# Patient Record
Sex: Male | Born: 1975 | Race: White | Hispanic: No | Marital: Single | Smoking: Never smoker
Health system: Southern US, Community
[De-identification: ages and names within clinical notes are randomized; demographics above are authoritative.]

## PROBLEM LIST (undated history)

## (undated) DIAGNOSIS — E7800 Pure hypercholesterolemia, unspecified: Secondary | ICD-10-CM

## (undated) HISTORY — PX: BACK SURGERY: SHX140

## (undated) HISTORY — PX: KNEE ARTHROSCOPY: SUR90

---

## 2016-04-13 ENCOUNTER — Encounter (HOSPITAL_COMMUNITY): Payer: Self-pay | Admitting: Emergency Medicine

## 2016-04-13 ENCOUNTER — Emergency Department (HOSPITAL_COMMUNITY): Payer: BLUE CROSS/BLUE SHIELD

## 2016-04-13 DIAGNOSIS — R0602 Shortness of breath: Secondary | ICD-10-CM | POA: Diagnosis present

## 2016-04-13 DIAGNOSIS — I10 Essential (primary) hypertension: Secondary | ICD-10-CM | POA: Insufficient documentation

## 2016-04-13 DIAGNOSIS — R079 Chest pain, unspecified: Secondary | ICD-10-CM | POA: Diagnosis not present

## 2016-04-13 LAB — I-STAT TROPONIN, ED: Troponin i, poc: 0 ng/mL (ref 0.00–0.08)

## 2016-04-13 NOTE — ED Triage Notes (Signed)
Pt here with intermittent CP ongoing for the past week, worsening today. Pt has hx of htn, high cholesterol, significant family history of heart problems.

## 2016-04-14 ENCOUNTER — Emergency Department (HOSPITAL_COMMUNITY)
Admission: EM | Admit: 2016-04-14 | Discharge: 2016-04-14 | Disposition: A | Discharge status: Home / Self Care | Payer: BLUE CROSS/BLUE SHIELD | Attending: Emergency Medicine | Admitting: Emergency Medicine

## 2016-04-14 DIAGNOSIS — R079 Chest pain, unspecified: Secondary | ICD-10-CM

## 2016-04-14 HISTORY — DX: Pure hypercholesterolemia, unspecified: E78.00

## 2016-04-14 LAB — CBC WITH DIFFERENTIAL/PLATELET
BASOS ABS: 0 10*3/uL (ref 0.0–0.1)
BASOS PCT: 0 %
EOS ABS: 0.3 10*3/uL (ref 0.0–0.7)
EOS PCT: 3 %
HCT: 39.6 % (ref 39.0–52.0)
Hemoglobin: 13.3 g/dL (ref 13.0–17.0)
Lymphocytes Relative: 44 %
Lymphs Abs: 3.9 10*3/uL (ref 0.7–4.0)
MCH: 29.4 pg (ref 26.0–34.0)
MCHC: 33.6 g/dL (ref 30.0–36.0)
MCV: 87.6 fL (ref 78.0–100.0)
MONO ABS: 0.8 10*3/uL (ref 0.1–1.0)
Monocytes Relative: 9 %
Neutro Abs: 3.9 10*3/uL (ref 1.7–7.7)
Neutrophils Relative %: 44 %
PLATELETS: 320 10*3/uL (ref 150–400)
RBC: 4.52 MIL/uL (ref 4.22–5.81)
RDW: 13 % (ref 11.5–15.5)
WBC: 8.9 10*3/uL (ref 4.0–10.5)

## 2016-04-14 LAB — BASIC METABOLIC PANEL
ANION GAP: 11 (ref 5–15)
BUN: 15 mg/dL (ref 6–20)
CALCIUM: 9.2 mg/dL (ref 8.9–10.3)
CO2: 25 mmol/L (ref 22–32)
Chloride: 101 mmol/L (ref 101–111)
Creatinine, Ser: 0.79 mg/dL (ref 0.61–1.24)
GLUCOSE: 93 mg/dL (ref 65–99)
Potassium: 3.8 mmol/L (ref 3.5–5.1)
SODIUM: 137 mmol/L (ref 135–145)

## 2016-04-14 LAB — TROPONIN I

## 2016-04-14 NOTE — ED Notes (Signed)
RN called to check on status of CBC and BMP ordered last night; lab states they "never got that blood"; pt to be stuck again for recollect

## 2016-04-14 NOTE — ED Provider Notes (Signed)
[facility]-EMERGENCY DEPT Provider Note   CSN: [id number] Arrival date & time: 04/13/16  2047     History   Chief Complaint Chief Complaint  Patient presents with  . Chest Pain  . Shortness of Breath    HPI Darryl Valdez is a 40 y.o. male.  HPI  Patient with PMH of high cholesterol and hypertension comes to the ER for evaluation of months of chest pain. His dad had 5 vessel bipass 12 years ago, his brother who is 10 years older than him had a heart attack recently. Pt is concerned that he likely has genetic heart disease as well. His pain is not exertional. It lasts anywhere from 1 minutes to 10 minutes. He broke a few right side carpal bones 1 week ago but says his pains have not changed and that this has been persisting for weeks. He came in today because his dad said that because of family history he really needed to be looked at.  Denies SOB, fevers, chills, cough, N/V/D, weakness, headache, confusion.  Past Medical History:  Diagnosis Date  . High cholesterol   . Hypertension     There are no active problems to display for this patient.   Past Surgical History:  Procedure Laterality Date  . BACK SURGERY    . KNEE ARTHROSCOPY Right        Home Medications    Prior to Admission medications   Medication Sig Start Date End Date Taking? Authorizing Provider  citalopram (CELEXA) 20 MG tablet Take 20 mg by mouth daily. 04/03/16  Yes Historical Provider, MD  ibuprofen (ADVIL,MOTRIN) 600 MG tablet Take 600 mg by mouth every 6 (six) hours as needed for mild pain.  04/03/16  Yes Historical Provider, MD  rosuvastatin (CRESTOR) 20 MG tablet Take 20 mg by mouth daily. 04/03/16  Yes Historical Provider, MD    Family History History reviewed. No pertinent family history.  Social History Social History  Substance Use Topics  . Smoking status: Never Smoker  . Smokeless tobacco: Never Used  . Alcohol use Yes     Comment: 3 times a week     Allergies   Review of patient's  allergies indicates no known allergies.   Review of Systems Review of Systems  Review of Systems All other systems negative except as documented in the HPI. All pertinent positives and negatives as reviewed in the HPI.  Physical Exam Updated Vital Signs BP 125/89   Pulse (!) 44   Temp 98 F (36.7 C)   Resp 13   Ht 6' (1.829 m)   Wt 77.1 kg   SpO2 99%   BMI 23.06 kg/m   Physical Exam  Constitutional: He is oriented to person, place, and time. He appears well-developed and well-nourished.  HENT:  Head: Normocephalic and atraumatic.  Eyes: EOM are normal. Pupils are equal, round, and reactive to light.  Neck: Normal range of motion.  Cardiovascular: Normal rate and regular rhythm.   Pulmonary/Chest: Effort normal and breath sounds normal. No tachypnea. No respiratory distress. He exhibits no tenderness and no crepitus.  Musculoskeletal: Normal range of motion.  Neurological: He is alert and oriented to person, place, and time.  Skin: Skin is warm and dry.     ED Treatments / Results  Labs (all labs ordered are listed, but only abnormal results are displayed) Labs Reviewed  TROPONIN I  CBC WITH DIFFERENTIAL/PLATELET  BASIC METABOLIC PANEL  BASIC METABOLIC PANEL  CBC  I-STAT TROPOININ, ED  EKG  EKG Interpretation None       Radiology Dg Chest 2 View  Result Date: 04/13/2016 CLINICAL DATA:  Left side chest pain, shortness of Breath EXAM: CHEST  2 VIEW COMPARISON:  None. FINDINGS: The heart size and mediastinal contours are within normal limits. Both lungs are clear. The visualized skeletal structures are unremarkable. IMPRESSION: No active cardiopulmonary disease. Electronically Signed   By: Natasha Mead M.D.   On: 04/13/2016 21:47    Procedures Procedures (including critical care time)  Medications Ordered in ED Medications - No data to display   Initial Impression / Assessment and Plan / ED Course  I have reviewed the triage vital signs and the nursing  notes.  Pertinent labs & imaging results that were available during my care of the patient were reviewed by me and considered in my medical decision making (see chart for details).  Clinical Course    Patient has had two negative Troponins and a normal EKG. He has a HEART score of 2 and his pain is very atypical. After discussing patient with Dr. Blinda Leatherwood he will require close cardiology follow-up. Pt given a cardiology referral.  Final Clinical Impressions(s) / ED Diagnoses   Final diagnoses:  Chest pain, unspecified chest pain type   Patient is to be discharged with recommendation to follow up with PCP in regards to today's hospital visit. Chest pain is not likely of cardiac or pulmonary etiology d/t presentation, perc negative, VSS, no tracheal deviation, no JVD or new murmur, RRR, breath sounds equal bilaterally, EKG without acute abnormalities, negative troponin, and negative CXR. Pt has been advised to return to the ED is CP becomes exertional, associated with diaphoresis or nausea, radiates to left jaw/arm, worsens or becomes concerning in any way. Pt appears reliable for follow up and is agreeable to discharge.   Case has been discussed with and seen by Dr. Blinda Leatherwood  who agrees with the above plan to discharge.    New Prescriptions New Prescriptions   No medications on file     Marlon Pel, PA-C 04/14/16 0515    Gilda Crease, MD 04/14/16 [phone]

## 2016-05-12 ENCOUNTER — Encounter: Payer: Self-pay | Admitting: Internal Medicine

## 2016-05-12 ENCOUNTER — Ambulatory Visit (INDEPENDENT_AMBULATORY_CARE_PROVIDER_SITE_OTHER): Payer: BLUE CROSS/BLUE SHIELD | Admitting: Internal Medicine

## 2016-05-12 VITALS — BP 131/86 | HR 62 | Ht 73.0 in | Wt 177.0 lb

## 2016-05-12 DIAGNOSIS — R079 Chest pain, unspecified: Secondary | ICD-10-CM | POA: Diagnosis not present

## 2016-05-12 DIAGNOSIS — E782 Mixed hyperlipidemia: Secondary | ICD-10-CM

## 2016-05-12 DIAGNOSIS — I10 Essential (primary) hypertension: Secondary | ICD-10-CM | POA: Diagnosis not present

## 2016-05-12 NOTE — Patient Instructions (Addendum)
Medication Instructions:  START- Aspirin 81 mg daily  Labwork: None Ordered  Testing/Procedures: Your physician has requested that you have an exercise tolerance test. For further information please visit [email or web address]. Please also follow instruction sheet, as given.  Follow-Up: Your physician recommends that you schedule a follow-up appointment in: November   Any Other Special Instructions Will Be Listed Below (If Applicable).   If you need a refill on your cardiac medications before your next appointment, please call your pharmacy.

## 2016-05-12 NOTE — Progress Notes (Signed)
OFFICE NOTE  Chief Complaint:  Chest pain  Primary Care Physician: No primary care provider on file.  HPI:  Darryl Valdez is a 40 y.o. male who presents after recent ER visit for chest pain. His past medical significant for hypertension and dyslipidemia. He is not physically active be on chasing his kids around. 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 He has a strong family history of coronary disease, and fact his brother who is age 2 had 2 coronary stents placed. His dad had four-vessel bypass at age 8. Recently he was seen in the emergency department at [facility] for chest pain. He ruled out for MI and was felt safe to discharge with cardiology follow-up. EKG performed demonstrated normal sinus rhythm with mild J-point elevation diffusely concerning for early repolarization. He shared lab work with me from his primary care provider indicating that his total cholesterol was well over 200 and he was recently started on Crestor 20 mg daily. Currently his total cholesterol is around 160 with an LDL of 78. He reports that he still has some intermittent episodes of chest pain which is described as sharp and sometimes radiates to the left arm. Not necessarily associated with exertion or relieved by rest. The episodes are not as significant as the episode that brought him to the hospital.  PMHx:  Past Medical History:  Diagnosis Date  . High cholesterol     Past Surgical History:  Procedure Laterality Date  . BACK SURGERY    . KNEE ARTHROSCOPY Right     FAMHx:  Family History  Problem Relation Age of Onset  . Hypertension Mother   . Hyperlipidemia Mother   . Hypertension Father   . Hyperlipidemia Father   . CAD Father   . CAD Brother   . Hypertension Brother   . Hyperlipidemia Brother     SOCHx:   reports that he has never smoked. He has never used smokeless tobacco. He reports that he drinks alcohol. He reports that he  does not use drugs.  ALLERGIES:  No Known Allergies  ROS: Pertinent items noted in HPI and remainder of comprehensive ROS otherwise negative.  HOME MEDS: Current Outpatient Prescriptions on File Prior to Visit  Medication Sig Dispense Refill  . citalopram (CELEXA) 20 MG tablet Take 20 mg by mouth daily.  8  . ibuprofen (ADVIL,MOTRIN) 600 MG tablet Take 600 mg by mouth every 6 (six) hours as needed for mild pain.   0  . rosuvastatin (CRESTOR) 20 MG tablet Take 20 mg by mouth daily.  2   No current facility-administered medications on file prior to visit.     LABS/IMAGING: No results found for this or any previous visit (from the past 48 hour(s)). No results found.  WEIGHTS: Wt Readings from Last 3 Encounters:  05/12/16 177 lb (80.3 kg)  04/13/16 170 lb (77.1 kg)    VITALS: BP 131/86   Pulse 62   Ht 6\' 1"  (1.854 m)   Wt 177 lb (80.3 kg)   BMI 23.35 kg/m   EXAM: General appearance: alert and no distress Neck: no carotid bruit and no JVD Lungs: clear to auscultation bilaterally Heart: regular rate and rhythm, S1, S2 normal, no murmur, click, rub or gallop Abdomen: soft, non-tender; bowel sounds normal; no masses,  no organomegaly Extremities: right wrist in a splint from an elevator accident Pulses: 2+ and symmetric Skin: Skin color, texture, turgor normal. No  rashes or lesions Neurologic: Grossly normal Psych: Pleasant  EKG: I personally reviewed his EKGs from the hospital on 04/13/2016 demonstrating normal sinus rhythm, no ischemia and early repolarization changes.  ASSESSMENT: 1. Chest pain with typical and atypical features 2. Strong family history of premature coronary artery disease 3. Dyslipidemia  PLAN: 1.   Darryl Valdez has a compelling family history of coronary disease of early onset and has been having episodes of chest pain. He has dyslipidemia but it is significantly improved on Crestor 20 mg daily. His LDL is now just above 70. Given his ongoing  symptoms like for him to undergo exercise treadmill stress testing. If this study is abnormal or equivocal, then I would recommend CT coronary angiography to further assess for obstructive coronary disease. This would give us a better idea about his overall risk and plaque burden. Ultimately, if he has continued symptoms he may need traditional coronary angiography. I've advised starting aspirin 81 mg daily. He says he will be traveling for work over the next 2 weeks and if he were to have any recurrent chest pain he's advised to go to the nearest hospital to seek treatment. Follow-up with me after he returns.  Chrystie NoseKenneth C. Hilty, MD, [facility] Attending Cardiologist [facility]  Chrystie NoseKenneth C Hilty 05/12/2016, 5:53 PM

## 2016-05-30 ENCOUNTER — Telehealth (HOSPITAL_COMMUNITY): Payer: Self-pay

## 2016-05-30 NOTE — Telephone Encounter (Signed)
Encounter complete. 

## 2016-06-01 ENCOUNTER — Ambulatory Visit (HOSPITAL_COMMUNITY)
Admission: RE | Admit: 2016-06-01 | Discharge: 2016-06-01 | Disposition: A | Discharge status: Home / Self Care | Payer: BLUE CROSS/BLUE SHIELD | Source: Ambulatory Visit | Attending: Cardiovascular Disease | Admitting: Cardiovascular Disease

## 2016-06-01 DIAGNOSIS — R079 Chest pain, unspecified: Secondary | ICD-10-CM | POA: Insufficient documentation

## 2016-06-01 LAB — EXERCISE TOLERANCE TEST
CSEPED: 12 min
CSEPEW: 13.4 METS
CSEPHR: 98 %
CSEPPHR: 179 {beats}/min
Exercise duration (sec): 1 s
MPHR: 182 {beats}/min
RPE: 17
Rest HR: 72 {beats}/min

## 2016-06-05 ENCOUNTER — Telehealth: Payer: Self-pay | Admitting: Internal Medicine

## 2016-06-05 NOTE — Telephone Encounter (Signed)
°  Follow Up   Pt is calling to follow up on most recent stress test results. Please call.

## 2016-06-05 NOTE — Telephone Encounter (Signed)
Returned call to patient. Gave him results of stress test. He asked if there was any f/u with Dr Rennis GoldenHilty. Patient already has appt scheduled with Hilty on 07/04/16 that he had forgotten about. Patient thankful for the reminder and will see Dr Rennis GoldenHilty at that point.

## 2016-06-28 ENCOUNTER — Encounter: Payer: Self-pay | Admitting: *Deleted

## 2016-07-03 ENCOUNTER — Telehealth: Payer: Self-pay | Admitting: Internal Medicine

## 2016-07-03 NOTE — Telephone Encounter (Signed)
Closed encounter °

## 2016-07-04 ENCOUNTER — Ambulatory Visit: Payer: BLUE CROSS/BLUE SHIELD | Admitting: Internal Medicine

## 2016-07-07 ENCOUNTER — Encounter: Payer: Self-pay | Admitting: Internal Medicine

## 2016-07-07 ENCOUNTER — Ambulatory Visit (INDEPENDENT_AMBULATORY_CARE_PROVIDER_SITE_OTHER): Payer: BLUE CROSS/BLUE SHIELD | Admitting: Internal Medicine

## 2016-07-07 VITALS — BP 133/90 | HR 63 | Ht 73.0 in | Wt 174.8 lb

## 2016-07-07 DIAGNOSIS — R072 Precordial pain: Secondary | ICD-10-CM

## 2016-07-07 DIAGNOSIS — E782 Mixed hyperlipidemia: Secondary | ICD-10-CM

## 2016-07-07 DIAGNOSIS — I10 Essential (primary) hypertension: Secondary | ICD-10-CM | POA: Diagnosis not present

## 2016-07-07 DIAGNOSIS — Z8249 Family history of ischemic heart disease and other diseases of the circulatory system: Secondary | ICD-10-CM | POA: Diagnosis not present

## 2016-07-07 NOTE — Patient Instructions (Signed)
Medication Instructions:   NO CHANGE  Testing/Procedures:  Your physician has requested that you have cardiac CT. Cardiac computed tomography (CT) is a painless test that uses an x-ray machine to take clear, detailed pictures of your heart. For further information please visit [email or web address]. Please follow instruction sheet as given.     Follow-Up:  Your physician recommends that you schedule a follow-up appointment WITH DR HILTY AFTER TESTING COMPLETE

## 2016-07-07 NOTE — Progress Notes (Signed)
OFFICE NOTE  Chief Complaint:  Persistent chest pain  Primary Care Physician: Dolan AmenBAILEY, SARAH M, FNP  HPI:  Darryl Valdez is a 40 y.o. male who presents after recent ER visit for chest pain. His past medical significant for hypertension and dyslipidemia. He is not physically active be on chasing his kids around. 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 He has a strong family history of coronary disease, and fact his brother who is age 38 had 2 coronary stents placed. His dad had four-vessel bypass at age 458. Recently he was [facility] for chest pain. He ruled out for MI and was felt safe to discharge with cardiology follow-up. EKG performed demonstrated normal sinus rhythm with mild J-point elevation diffusely concerning for early repolarization. He shared lab work with me from his primary care provider indicating that his total cholesterol was well over 200 and he was recently started on Crestor 20 mg daily. Currently his total cholesterol is around 160 with an LDL of 78. He reports that he still has some intermittent episodes of chest pain which is described as sharp and sometimes radiates to the left arm. Not necessarily associated with exertion or relieved by rest. The episodes are not as significant as the episode that brought him to the hospital.  07/07/2016  Mr. Darryl Valdez returns today for follow-up. He underwent an exercise treadmill stress test which was negative for ischemia. He was noted to have some mild diastolic hypertension today. He reports he has continued recurrent sharp chest pain. It is of the left anterior chest. He reports no improvement with aspirin. His symptoms come intermittently are not associated with exertion or relieved by rest. He is quite concerned about his family history of heart disease which is very strong in both his brother and father. He feels like the low sensitivity of the  stress test may have missed his coronary artery disease. We discussed medical therapy including considering adding a long-acting nitrate or beta blocker however he does not want to take additional medicine until he knows if he has coronary artery disease or not. I informed him that the only way we could look further into this would be either considering CT coronary angiography or a traditional cardiac catheterization. The former would introduce less risk. If we do demonstrate that his chest pain may not be coronary, then one would consider possible cervical radiculopathy as a cause of some of his symptoms. He does have a history of 2 prior lumbar discectomies and cervical stenosis.  PMHx:  Past Medical History:  Diagnosis Date  . High cholesterol     Past Surgical History:  Procedure Laterality Date  . BACK SURGERY    . KNEE ARTHROSCOPY Right     FAMHx:  Family History  Problem Relation Age of Onset  . Hypertension Mother   . Hyperlipidemia Mother   . Hypertension Father   . Hyperlipidemia Father   . CAD Father   . CAD Brother   . Hypertension Brother   . Hyperlipidemia Brother     SOCHx:   reports that he has never smoked. He has never used smokeless tobacco. He reports that he drinks alcohol. He reports that he does not use drugs.  ALLERGIES:  No Known Allergies  ROS: Pertinent items noted in HPI and remainder of comprehensive ROS otherwise negative.  HOME MEDS: Current Outpatient Prescriptions on File Prior to Visit  Medication Sig Dispense Refill  . aspirin EC 81 MG tablet Take 81 mg by mouth daily.    . citalopram (CELEXA) 20 MG tablet Take by mouth daily. Pt takes 30 mg  8  . ibuprofen (ADVIL,MOTRIN) 600 MG tablet Take 600 mg by mouth every 6 (six) hours as needed for mild pain.   0  . rosuvastatin (CRESTOR) 20 MG tablet Take 20 mg by mouth daily.  2   No current facility-administered medications on file prior to visit.     LABS/IMAGING: No results found for this  or any previous visit (from the past 48 hour(s)). No results found.  WEIGHTS: Wt Readings from Last 3 Encounters:  07/07/16 174 lb 12.8 oz (79.3 kg)  05/12/16 177 lb (80.3 kg)  04/13/16 170 lb (77.1 kg)    VITALS: BP 133/90   Pulse 63   Ht 6\' 1"  (1.854 m)   Wt 174 lb 12.8 oz (79.3 kg)   BMI 23.06 kg/m   EXAM: Deferred  EKG: Deferred  ASSESSMENT: 1. Recurrent chest pain - low risk exercise treadmill stress test with recurrent symptoms 2. Strong family history of premature coronary artery disease 3. Dyslipidemia  PLAN: 1.   Mr. Darryl Valdez tinnitus to have recurrent chest pain symptoms however they are somewhat atypical. He is exercise tolerance test was negative for ischemia however his symptoms persist. Blood pressure is a little more elevated today however I don't think that's the cause of his symptoms. He is very concerned about coronary artery disease given his strong family history. I'm recommending a cardiac CT angiogram to evaluate his coronaries. If this is negative, then no further coronary testing would be necessary. I would focus on other possible causes including cervical spine disease and which case he may need either MRI or nerve conduction studies.  Follow-up after his CT angiogram.  Chrystie NoseKenneth C. Janisse Ghan, MD, [facility]ACC Attending Cardiologist [facility]  Chrystie NoseKenneth C Emiline Mancebo 07/07/2016, 2:51 PM

## 2016-07-07 NOTE — Addendum Note (Signed)
Addended by: Chrystie NoseHILTY, Beatrice Sehgal C on: 07/07/2016 03:00 PM   Modules accepted: Orders

## 2016-07-21 ENCOUNTER — Encounter: Payer: Self-pay | Admitting: Internal Medicine

## 2016-07-26 ENCOUNTER — Encounter (HOSPITAL_COMMUNITY): Payer: Self-pay

## 2016-07-26 ENCOUNTER — Ambulatory Visit (HOSPITAL_COMMUNITY)
Admission: RE | Admit: 2016-07-26 | Discharge: 2016-07-26 | Disposition: A | Discharge status: Home / Self Care | Payer: BLUE CROSS/BLUE SHIELD | Source: Ambulatory Visit | Attending: Internal Medicine | Admitting: Internal Medicine

## 2016-07-26 DIAGNOSIS — Z8249 Family history of ischemic heart disease and other diseases of the circulatory system: Secondary | ICD-10-CM | POA: Diagnosis not present

## 2016-07-26 DIAGNOSIS — R072 Precordial pain: Secondary | ICD-10-CM | POA: Diagnosis not present

## 2016-07-26 DIAGNOSIS — I10 Essential (primary) hypertension: Secondary | ICD-10-CM

## 2016-07-26 DIAGNOSIS — R079 Chest pain, unspecified: Secondary | ICD-10-CM | POA: Diagnosis not present

## 2016-07-26 DIAGNOSIS — E782 Mixed hyperlipidemia: Secondary | ICD-10-CM | POA: Insufficient documentation

## 2016-07-26 MED ORDER — METOPROLOL TARTRATE 5 MG/5ML IV SOLN
INTRAVENOUS | Status: AC
  Filled 2016-07-26: qty 5

## 2016-07-26 MED ORDER — NITROGLYCERIN 0.4 MG SL SUBL
0.8000 mg | SUBLINGUAL_TABLET | Freq: Once | SUBLINGUAL | Status: AC
  Administered 2016-07-26: 0.8 mg via SUBLINGUAL
  Filled 2016-07-26: qty 25

## 2016-07-26 MED ORDER — NITROGLYCERIN 0.4 MG SL SUBL
SUBLINGUAL_TABLET | SUBLINGUAL | Status: AC
  Filled 2016-07-26: qty 2

## 2016-07-26 MED ORDER — METOPROLOL TARTRATE 5 MG/5ML IV SOLN
5.0000 mg | Freq: Once | INTRAVENOUS | Status: AC
  Administered 2016-07-26: 5 mg via INTRAVENOUS
  Filled 2016-07-26: qty 5

## 2016-07-26 MED ORDER — IOPAMIDOL (ISOVUE-370) INJECTION 76%
INTRAVENOUS | Status: AC
  Administered 2016-07-26: 80 mL
  Filled 2016-07-26: qty 100

## 2017-01-23 ENCOUNTER — Encounter: Payer: Self-pay | Admitting: Internal Medicine

## 2017-04-24 IMAGING — CT CT HEART MORP W/ CTA COR W/ SCORE W/ CA W/CM &/OR W/O CM
1 of 10 series · 1 of 20 positions shown, 2 images · non-contrast
Comparison: None.

EXAM:
OVER-READ INTERPRETATION  CT CHEST

The following report is an over-read performed by radiologist Dr.
Luzineidy Aurio [REDACTED] on 07/26/2016. This
over-read does not include interpretation of cardiac or coronary
anatomy or pathology. The coronary CTA interpretation by the
cardiologist is attached.
CLINICAL DATA: Chest pain
Cardiac CTA
MEDICATIONS:
Sub lingual nitro. 4mg and lopressor 5mg
TECHNIQUE: The patient was scanned on a [employer] [REDACTED]ice scanner. Gantry
rotation speed was 270 msecs. Collimation was .9mm. A 100 kV
prospective scan was triggered in the descending thoracic aorta at
111 HU's with 5% padding centered around 78% of the R-R interval.
Average HR during the scan was 54 bpm. The 3D data set was
interpreted on a dedicated work station using MPR, MIP and VRT
modes. A total of 80cc of contrast was used.

[Series 300: locator · axial · 0.35mm/px · z∈[-207,-207]mm · 1 of 1 slices shown, 2 images]
[im 1/1  vessel]
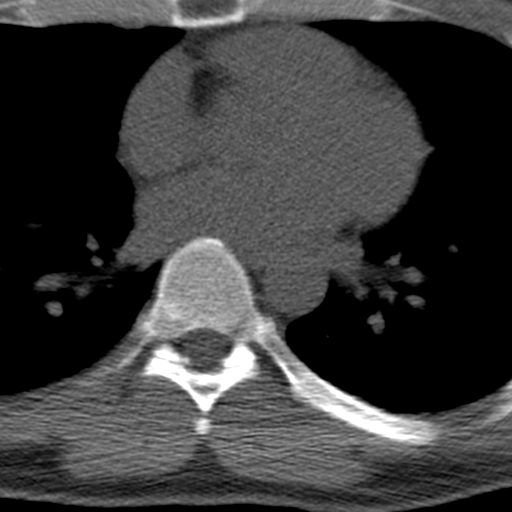
[im 1/1  lung]
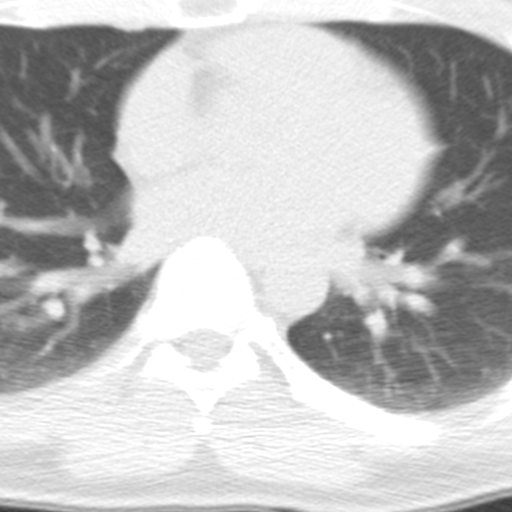

[1 of 20 positions shown; findings below may reference images not displayed]

FINDINGS: Visualized lungs are clear. No suspicious pulmonary nodules. Mild
dependent atelectasis. No focal consolidation. No pleural effusion
or pneumothorax.

The heart is normal in size.

No evidence of thoracic aortic aneurysm.

6 mm short axis prevascular node (series 203/ image 8). No
suspicious thoracic lymphadenopathy.

Visualized upper abdomen is unremarkable.

No focal osseous lesions.
IMPRESSION: No significant extracardiac findings.
FINDINGS: Non-cardiac: See separate report from [REDACTED]. No
significant findings on limited lung and soft tissue windows.

Ascending aorta 3.1 cm No coronary anomalies

Calcium Score:  1 isolated punctate area of calcium in proximal LAD

Coronary Arteries: Right dominant with no anomalies

LM: Normal

LAD: Isolated area of less than 20% calcified plaque in proximal LAD

IM:  Normal

D1: Normal

D2: Normal

D3: Normal

Circumflex: Normal

OM1: Normal

RCA:  Dominant and normal

PDA:  Normal

PLA:  Normal
IMPRESSION: 1) No coronary anomalies

2) Calcium score 1 isolated punctate area calcium in proximal LAD
given young age this is 81st percentile for age and sex

3) Essentially normal right dominant coronary arteries with less
than 20% calcified lesion in proximal LAD

4) Normal aorta root 3.1 cm

[employer] [place] [employer]

## 2017-10-20 ENCOUNTER — Other Ambulatory Visit: Payer: Self-pay | Admitting: Family Medicine

## 2018-01-29 ENCOUNTER — Telehealth: Payer: Self-pay | Admitting: Internal Medicine

## 2018-01-29 NOTE — Telephone Encounter (Signed)
Pt calling about his bill for Cardiac CTA 07-26-16.  [facility] to [facility].
